# Patient Record
Sex: Female | Born: 2000 | Race: Black or African American | Hispanic: No | Marital: Single | Smoking: Never smoker
Health system: Southern US, Community
[De-identification: ages and names within clinical notes are randomized; demographics above are authoritative.]

---
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
## 2022-10-03 ENCOUNTER — Emergency Department (HOSPITAL_COMMUNITY): Payer: BC Managed Care – PPO

## 2022-10-03 ENCOUNTER — Encounter (HOSPITAL_COMMUNITY): Payer: Self-pay

## 2022-10-03 ENCOUNTER — Emergency Department (HOSPITAL_COMMUNITY)
Admission: EM | Admit: 2022-10-03 | Discharge: 2022-10-03 | Disposition: A | Discharge status: Home / Self Care | Payer: BC Managed Care – PPO | Attending: Emergency Medicine | Admitting: Emergency Medicine

## 2022-10-03 ENCOUNTER — Other Ambulatory Visit: Payer: Self-pay

## 2022-10-03 DIAGNOSIS — B2790 Infectious mononucleosis, unspecified without complication: Secondary | ICD-10-CM | POA: Diagnosis not present

## 2022-10-03 DIAGNOSIS — D72829 Elevated white blood cell count, unspecified: Secondary | ICD-10-CM | POA: Diagnosis not present

## 2022-10-03 DIAGNOSIS — J351 Hypertrophy of tonsils: Secondary | ICD-10-CM | POA: Insufficient documentation

## 2022-10-03 DIAGNOSIS — R1310 Dysphagia, unspecified: Secondary | ICD-10-CM | POA: Diagnosis present

## 2022-10-03 DIAGNOSIS — Z1152 Encounter for screening for COVID-19: Secondary | ICD-10-CM | POA: Insufficient documentation

## 2022-10-03 LAB — COMPREHENSIVE METABOLIC PANEL
ALT: 41 U/L (ref 0–44)
AST: 33 U/L (ref 15–41)
Albumin: 3 g/dL — ABNORMAL LOW (ref 3.5–5.0)
Alkaline Phosphatase: 88 U/L (ref 38–126)
Anion gap: 10 (ref 5–15)
BUN: 5 mg/dL — ABNORMAL LOW (ref 6–20)
CO2: 24 mmol/L (ref 22–32)
Calcium: 8.7 mg/dL — ABNORMAL LOW (ref 8.9–10.3)
Chloride: 103 mmol/L (ref 98–111)
Creatinine, Ser: 0.84 mg/dL (ref 0.44–1.00)
GFR, Estimated: >60 mL/min (ref >60)
Glucose, Bld: 114 mg/dL — ABNORMAL HIGH (ref 70–99)
Potassium: 4 mmol/L (ref 3.5–5.1)
Sodium: 137 mmol/L (ref 135–145)
Total Bilirubin: 0.8 mg/dL (ref 0.3–1.2)
Total Protein: 7 g/dL (ref 6.5–8.1)

## 2022-10-03 LAB — CBC WITH DIFFERENTIAL/PLATELET
Abs Immature Granulocytes: 0 10*3/uL (ref 0.00–0.07)
Basophils Absolute: 0 10*3/uL (ref 0.0–0.1)
Basophils Relative: 0 %
Eosinophils Absolute: 0 10*3/uL (ref 0.0–0.5)
Eosinophils Relative: 0 %
HCT: 38.7 % (ref 36.0–46.0)
Hemoglobin: 12.5 g/dL (ref 12.0–15.0)
Lymphocytes Relative: 22 %
Lymphs Abs: 4 10*3/uL (ref 0.7–4.0)
MCH: 28.7 pg (ref 26.0–34.0)
MCHC: 32.3 g/dL (ref 30.0–36.0)
MCV: 88.8 fL (ref 80.0–100.0)
Monocytes Absolute: 1.6 10*3/uL — ABNORMAL HIGH (ref 0.1–1.0)
Monocytes Relative: 9 %
Neutro Abs: 12.5 10*3/uL — ABNORMAL HIGH (ref 1.7–7.7)
Neutrophils Relative %: 69 %
Platelets: 253 10*3/uL (ref 150–400)
RBC: 4.36 MIL/uL (ref 3.87–5.11)
RDW: 12.2 % (ref 11.5–15.5)
WBC: 18.1 10*3/uL — ABNORMAL HIGH (ref 4.0–10.5)
nRBC: 0 % (ref 0.0–0.2)
nRBC: 0 /100 WBC

## 2022-10-03 LAB — RESP PANEL BY RT-PCR (FLU A&B, COVID) ARPGX2
Influenza A by PCR: NEGATIVE
Influenza B by PCR: NEGATIVE
SARS Coronavirus 2 by RT PCR: NEGATIVE

## 2022-10-03 LAB — HCG, QUANTITATIVE, PREGNANCY: hCG, Beta Chain, Quant, S: 2 m[IU]/mL (ref <5)

## 2022-10-03 LAB — MONONUCLEOSIS SCREEN: Mono Screen: POSITIVE — AB

## 2022-10-03 MED ORDER — MORPHINE SULFATE (PF) 2 MG/ML IV SOLN
2.0000 mg | Freq: Once | INTRAVENOUS | Status: AC
  Administered 2022-10-03: 2 mg via INTRAVENOUS
  Filled 2022-10-03: qty 1

## 2022-10-03 MED ORDER — IOHEXOL 350 MG/ML SOLN
75.0000 mL | Freq: Once | INTRAVENOUS | Status: AC | PRN
  Administered 2022-10-03: 75 mL via INTRAVENOUS

## 2022-10-03 MED ORDER — SODIUM CHLORIDE 0.9 % IV BOLUS
1000.0000 mL | Freq: Once | INTRAVENOUS | Status: AC
  Administered 2022-10-03: 1000 mL via INTRAVENOUS

## 2022-10-03 MED ORDER — ONDANSETRON HCL 4 MG/2ML IJ SOLN
4.0000 mg | Freq: Once | INTRAMUSCULAR | Status: AC
  Administered 2022-10-03: 4 mg via INTRAVENOUS
  Filled 2022-10-03: qty 2

## 2022-10-03 MED ORDER — LACTATED RINGERS IV BOLUS
1000.0000 mL | Freq: Once | INTRAVENOUS | Status: AC
  Administered 2022-10-03: 1000 mL via INTRAVENOUS

## 2022-10-03 MED ORDER — PREDNISONE INTENSOL 5 MG/ML PO CONC: 40.0000 mg | Freq: Every day | ORAL | 0 refills | Status: AC

## 2022-10-03 MED ORDER — DEXAMETHASONE SODIUM PHOSPHATE 10 MG/ML IJ SOLN
10.0000 mg | Freq: Once | INTRAMUSCULAR | Status: AC
  Administered 2022-10-03: 10 mg via INTRAVENOUS
  Filled 2022-10-03: qty 1

## 2022-10-03 NOTE — ED Notes (Signed)
All labs redraw

## 2022-10-03 NOTE — ED Provider Triage Note (Signed)
Emergency Medicine Provider Triage Evaluation Note  Belinda Dawson , a 21 y.o. female  was evaluated in triage.  Pt complains of inability to swallow.  Parents are at bedside.  Mom states patient initially had symptoms of sore throat, fatigue started November 8.  Symptoms progressively worsened.  She was then diagnosed with bronchitis.  Since Thursday she has had difficulty swallowing, and progressive worsening sensation of throat swelling.  Patient [facility] is spitting up into an emesis bag.  Has not been able to tolerate p.o. intake according to mom prior to arrival.  According to mother patient was recently diagnosed with mono.   Review of Systems  Positive: As above Negative: As above  Physical Exam  BP (!) 150/89 (BP Location: Right Arm)   Pulse (!) 128   Temp 99.1 F (37.3 C) (Oral)   Resp (!) 22   Ht 5\' 6"  (1.676 m)   Wt 70.8 kg   SpO2 99%   BMI 25.18 kg/m  Gen:   Awake, no distress   Resp:  Normal effort  MSK:   Moves extremities without difficulty  Other:    Medical Decision Making  Medically screening exam initiated at 5:22 AM.  Appropriate orders placed.  SHEVY YANEY was informed that the remainder of the evaluation will be completed by another provider, this initial triage assessment does not replace that evaluation, and the importance of remaining in the ED until their evaluation is complete.     Laurice Record, PA-C 10/03/22 [phone]

## 2022-10-03 NOTE — ED Provider Notes (Signed)
[facility] EMERGENCY DEPARTMENT Provider Note   CSN: [id number] Arrival date & time: 10/03/22  0509     History  Chief Complaint  Patient presents with   Dysphagia    Belinda Dawson is a 21 y.o. female presents to the ER with complaint of difficulty swallowing.  Patient was recently diagnosed with mononucleosis and reports that her symptoms have been progressively getting worse since around Thanksgiving.  She states she has had sore throat and fatigue since the beginning of November.  Patient has been using lidocaine liquid to gargle and help with pain, but she reports this does not help.  Patient also has difficulty speaking due to pain and swollen tonsils.   Endorses abdominal pain, myalgias, and ear pain with above mentioned symptoms.  Denies fever, shortness of breath, stridor, chest pain, nausea, vomiting, diarrhea.      Home Medications Prior to Admission medications   Medication Sig Start Date End Date Taking? Authorizing Provider  predniSONE (PREDNISONE INTENSOL) 5 MG/ML concentrated solution Take 8 mLs (40 mg total) by mouth daily with breakfast for 5 days. 10/03/22 10/08/22 Yes Foster Frericks R, PA      Allergies    Patient has no known allergies.    Review of Systems   Review of Systems  Constitutional:  Negative for fever.  HENT:  Positive for ear pain, sore throat, trouble swallowing and voice change.   Respiratory:  Negative for shortness of breath.   Cardiovascular:  Negative for chest pain.  Gastrointestinal:  Positive for abdominal pain. Negative for diarrhea, nausea and vomiting.  Musculoskeletal:  Positive for myalgias.    Physical Exam Updated Vital Signs BP 126/77 (BP Location: Right Arm)   Pulse (!) 101   Temp 98.4 F (36.9 C) (Axillary)   Resp 18   Ht 5\' 6"  (1.676 m)   Wt 70.8 kg   SpO2 96%   BMI 25.18 kg/m  Physical Exam Vitals and nursing note reviewed.  Constitutional:      General: She is sleeping. She is not in acute  distress.    Appearance: She is ill-appearing. She is not toxic-appearing.     Comments: Patient initially sleeping, was slightly difficult to wake and was very lethargic, able to follow commands, had difficulty staying awake  HENT:     Mouth/Throat:     Mouth: Mucous membranes are moist.     Pharynx: Oropharynx is clear. Uvula midline. Posterior oropharyngeal erythema present. No oropharyngeal exudate or uvula swelling.     Tonsils: Tonsillar exudate present. 3+ on the right. 3+ on the left.     Comments: Tonsils are enlarged, erythematous, and have white streaking.  Patient able to swallow, but it is difficult and she experiences tremors due to pain when swallowing Neck:     Thyroid: No thyromegaly.  Cardiovascular:     Rate and Rhythm: Normal rate and regular rhythm.     Pulses: Normal pulses.     Heart sounds: Normal heart sounds.  Pulmonary:     Effort: Pulmonary effort is normal. No respiratory distress.     Breath sounds: Normal breath sounds and air entry.  Abdominal:     General: Abdomen is flat. Bowel sounds are normal. There is no distension.     Palpations: Abdomen is soft.     Tenderness: There is no abdominal tenderness.  Musculoskeletal:     Cervical back: Normal range of motion. No rigidity.  Lymphadenopathy:     Cervical: Cervical adenopathy present.  Right cervical: Posterior cervical adenopathy present.     Left cervical: Posterior cervical adenopathy present.  Skin:    General: Skin is warm and dry.     Capillary Refill: Capillary refill takes less than 2 seconds.  Neurological:     Mental Status: Mental status is at baseline. She is lethargic.  Psychiatric:        Mood and Affect: Mood normal.        Behavior: Behavior normal.     ED Results / Procedures / Treatments   Labs (all labs ordered are listed, but only abnormal results are displayed) Labs Reviewed  CBC WITH DIFFERENTIAL/PLATELET - Abnormal; Notable for the following components:      Result  Value   WBC 18.1 (*)    Neutro Abs 12.5 (*)    Monocytes Absolute 1.6 (*)    All other components within normal limits  MONONUCLEOSIS SCREEN - Abnormal; Notable for the following components:   Mono Screen POSITIVE (*)    All other components within normal limits  COMPREHENSIVE METABOLIC PANEL - Abnormal; Notable for the following components:   Glucose, Bld 114 (*)    BUN 5 (*)    Calcium 8.7 (*)    Albumin 3.0 (*)    All other components within normal limits  RESP PANEL BY RT-PCR (FLU A&B, COVID) ARPGX2  HCG, QUANTITATIVE, PREGNANCY  I-STAT BETA HCG BLOOD, ED ([facility], [facility], [facility] ONLY)    EKG None  Radiology DG Chest 2 View  Result Date: 10/03/2022 CLINICAL DATA:  Cough with fatigue and trouble swallowing. Patient states diagnosed with mono. EXAM: CHEST - 2 VIEW COMPARISON:  03/06/2013 FINDINGS: Lungs are adequately inflated without focal airspace consolidation or effusion. Cardiomediastinal silhouette and remainder of the exam is unremarkable. IMPRESSION: No active cardiopulmonary disease. Electronically Signed   By: Elberta Fortis M.D.   On: 10/03/2022 12:20   CT Soft Tissue Neck W Contrast  Result Date: 10/03/2022 CLINICAL DATA:  History of mono with difficulty swallowing. EXAM: CT NECK WITH CONTRAST TECHNIQUE: Multidetector CT imaging of the neck was performed using the standard protocol following the bolus administration of intravenous contrast. RADIATION DOSE REDUCTION: This exam was performed according to the departmental dose-optimization program which includes automated exposure control, adjustment of the mA and/or kV according to patient size and/or use of iterative reconstruction technique. CONTRAST:  60mL OMNIPAQUE IOHEXOL 350 MG/ML SOLN COMPARISON:  None Available. FINDINGS: Pharynx and larynx: Extensive tonsillar thickening with hyperenhancement and low-density surface attributed to purulence. Submucosal and retropharyngeal edema without collection. Negative epiglottis. Salivary  glands: No inflammation, mass, or stone. Thyroid: Normal. Lymph nodes: Enlarged and avidly enhancing cervical lymph nodes, expected for the degree of tonsillar inflammation. No cavitation Vascular: Negative Limited intracranial: Negative Visualized orbits: Negative Mastoids and visualized paranasal sinuses: Retention cyst along the floor of the right maxillary sinus where there is also mucosal thickening. Skeleton: Negative Upper chest: Negative IMPRESSION: Tonsillitis, retropharyngeal edema, and cervical adenitis without abscess. Electronically Signed   By: Tiburcio Pea M.D.   On: 10/03/2022 09:59    Procedures Procedures    Medications Ordered in ED Medications  lactated ringers bolus 1,000 mL (0 mLs Intravenous Stopped 10/03/22 0644)  ondansetron (ZOFRAN) injection 4 mg (4 mg Intravenous Given 10/03/22 0542)  iohexol (OMNIPAQUE) 350 MG/ML injection 75 mL (75 mLs Intravenous Contrast Given 10/03/22 0936)  morphine (PF) 2 MG/ML injection 2 mg (2 mg Intravenous Given 10/03/22 1154)  dexamethasone (DECADRON) injection 10 mg (10 mg Intravenous Given 10/03/22 1154)  sodium chloride 0.9 % bolus 1,000 mL (1,000 mLs Intravenous New Bag/Given 10/03/22 1257)    ED Course/ Medical Decision Making/ A&P                           Medical Decision Making Amount and/or Complexity of Data Reviewed Labs: ordered. Radiology: ordered.  Risk Prescription drug management.   This patient presents to the ED with chief complaint(s) of dysphagia, odynophagia, reduced PO intake with pertinent past medical history of recent mono diagnosis .The complaint involves an extensive differential diagnosis and also carries with it a high risk of complications and morbidity.    The differential diagnosis includes EBV, retropharyngeal abscess, tonsillar abscess, peritonsillar cellulitis, peritonsillar abscess, COVID, influenza, Ludwig's angina, parapharyngeal abscess, epiglottitis  The initial plan is to obtain baseline  labs, CT soft tissue neck, and chest x-ray   Additional history obtained: Additional history obtained from family, mom and dad at bedside with patient  Initial Assessment:   Patient sleeping with snoring respirations appreciated.  Normal respiratory rate.  Able to wake patient, she remains lethargic and has some difficulty staying awake.  She is able to answer questions by nodding and shaking her head.  Patient does not want to use voice secondary to pain.  She is able to swallow, experiences tremors and increased pain when swallowing.    Tonsils are 3+, erythematous with white streaking.  Uvula midline.  Posterior oropharynx erythematous and edematous.  Posterior cervical lymphadenopathy appreciated on palpation.   Independent ECG/labs interpretation:  The following labs were independently interpreted:  Mononucleosis screen positive.  CBC reveals leukocytosis, WB 18.1  Independent visualization and interpretation of imaging: I independently visualized the following imaging with scope of interpretation limited to determining acute life threatening conditions related to emergency care: CXR, which revealed no evidence of pneumothorax, no infiltrates to suggest pneumonia, and no pleural effusions; heart is normal size  Treatment and Reassessment: Patient was treated with IV fluids, Decadron for edema, and morphine for pain.  Patient is now resting comfortably, still continues to have snoring respirations, but has no apnea on reassessment.  Parents report patient was feeling better following medications and was acting more like herself.    Patient passed PO challenge.   Disposition:   After consideration of the diagnostic results and the patients response to treatment, I feel that emergency department workup does not suggest an emergent condition requiring admission or immediate intervention beyond what has been performed at this time.  The patient is safe for discharge and has been instructed to  return immediately for worsening symptoms, change in symptoms or any other concerns.  Discussed with parents and patient results of work-up and that patient does not have an abscess, deep space infection, or pneumonia.  Will send patient home on oral predisone.  Recommended follow-up with primary care if her symptoms do not improve.            Final Clinical Impression(s) / ED Diagnoses Final diagnoses:  Infectious mononucleosis, with other complication, infectious mononucleosis due to unspecified organism  Tonsillar enlargement    Rx / DC Orders ED Discharge Orders          Ordered    predniSONE (PREDNISONE INTENSOL) 5 MG/ML concentrated solution  Daily with breakfast        10/03/22 1457              Lenard Simmer, [place] 10/03/22 1500    Benjiman Core, MD 10/03/22  1521  

## 2022-10-03 NOTE — ED Triage Notes (Signed)
Dx with mono and having difficulty swallowing.  Reports has increasingly got worse.  Reports having episodes of not making since.  Sore throat fatigue started nov 8th but other symptoms started Thursday.

## 2022-10-03 NOTE — ED Notes (Signed)
PO challenged passed.

## 2022-10-03 NOTE — ED Notes (Signed)
Patient transported to X-ray 

## 2022-10-03 NOTE — Discharge Instructions (Addendum)
Thank you for allowing me to be part of your care today.  You were treated in the ER for enlarged tonsils related to your mononucleosis.  We gave you fluids, steroids, and pain medicine.  I am sending you home on a steroid, I have prescribed liquid to make taking medicine easier.   Please follow-up with your primary care provider if your symptoms persist.  It is important for you to maintain good hydration and get plenty of rest.  Return to the ER if you develop any new or worsening signs or symptoms or have any new concerns.
